# Patient Record
Sex: Male | Born: 1995 | Race: Black or African American | Hispanic: No | Marital: Single | Smoking: Never smoker
Health system: Southern US, Community
[De-identification: ages and names within clinical notes are randomized; demographics above are authoritative.]

## PROBLEM LIST (undated history)

## (undated) DIAGNOSIS — K625 Hemorrhage of anus and rectum: Secondary | ICD-10-CM

## (undated) DIAGNOSIS — F819 Developmental disorder of scholastic skills, unspecified: Secondary | ICD-10-CM

## (undated) DIAGNOSIS — K5900 Constipation, unspecified: Secondary | ICD-10-CM

## (undated) DIAGNOSIS — H189 Unspecified disorder of cornea: Secondary | ICD-10-CM

## (undated) HISTORY — DX: Hemorrhage of anus and rectum: K62.5

## (undated) HISTORY — PX: TYMPANOSTOMY TUBE PLACEMENT: SHX32

## (undated) HISTORY — DX: Constipation, unspecified: K59.00

---
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
## 2018-10-24 IMAGING — CT CT RENAL STONE PROTOCOL
2 of 4 series · 15 of 46 positions shown, 17 images · non-contrast
Comparison: None.

CLINICAL DATA: dysuria, hematuria starting 5 a.m. this morning

EXAM:
CT ABDOMEN AND PELVIS WITHOUT CONTRAST
TECHNIQUE: Multidetector CT imaging of the abdomen and pelvis was performed
following the standard protocol without IV contrast.

[Series 2: stone full standard · axial · 0.81mm/px · z∈[-698,-258]mm · 12 of 105 slices shown, 14 images]
[im 9/105  soft-tissue]
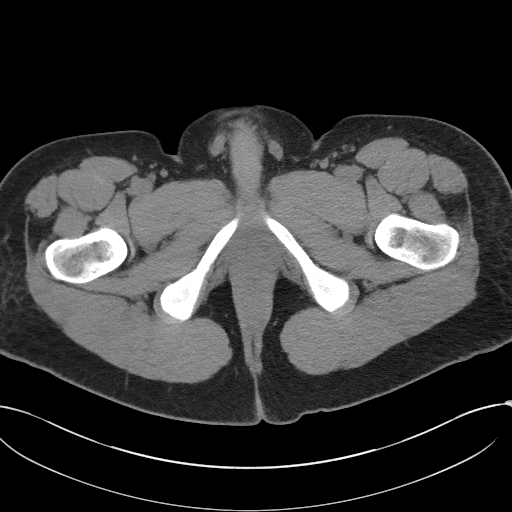
[im 9/105  bone]
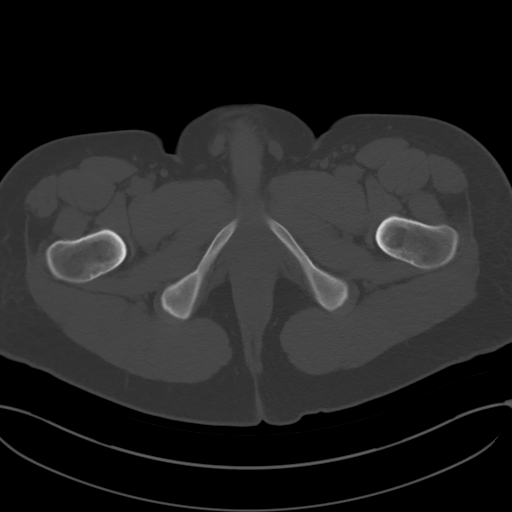
[im 17/105  soft-tissue]
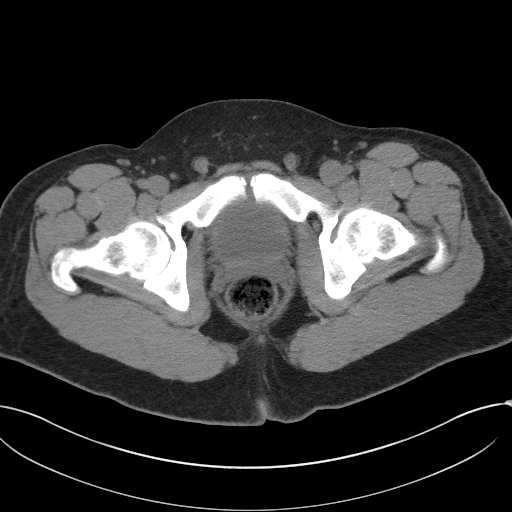
[im 25/105  soft-tissue]
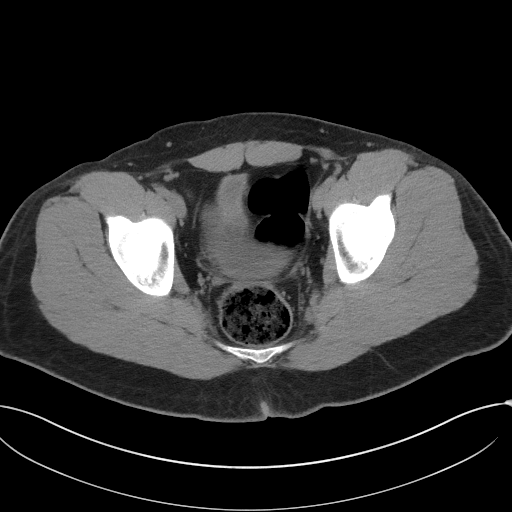
[im 33/105  soft-tissue]
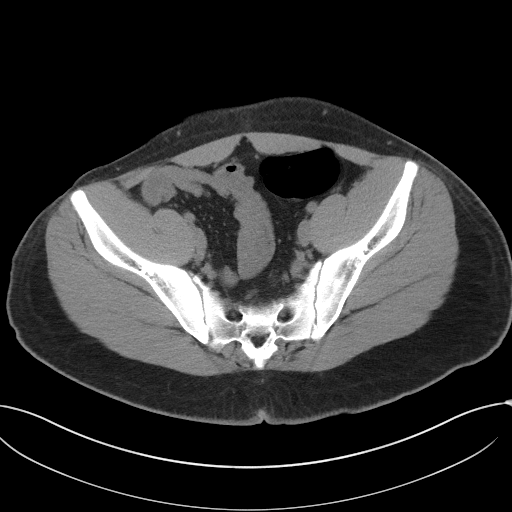
[im 41/105  soft-tissue]
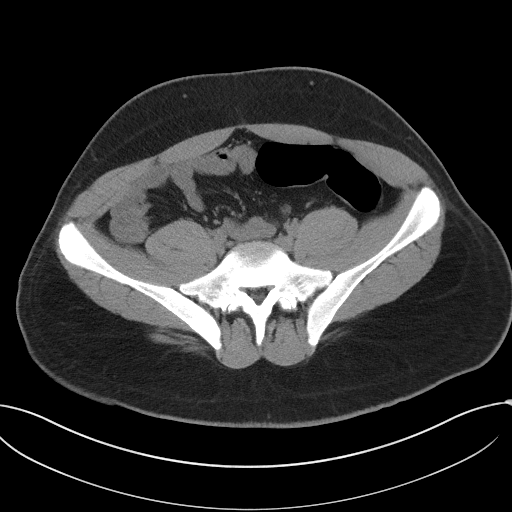
[im 49/105  soft-tissue]
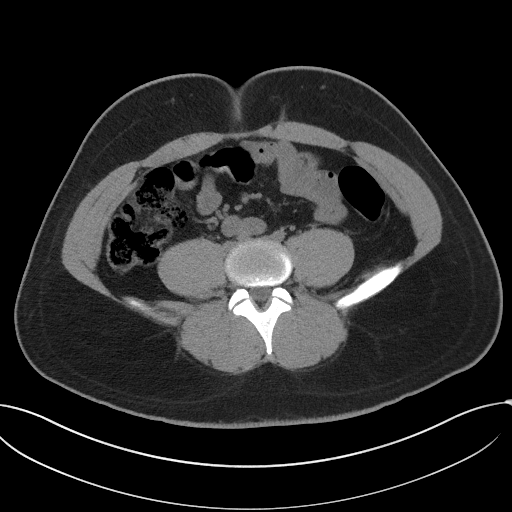
[im 57/105  soft-tissue]
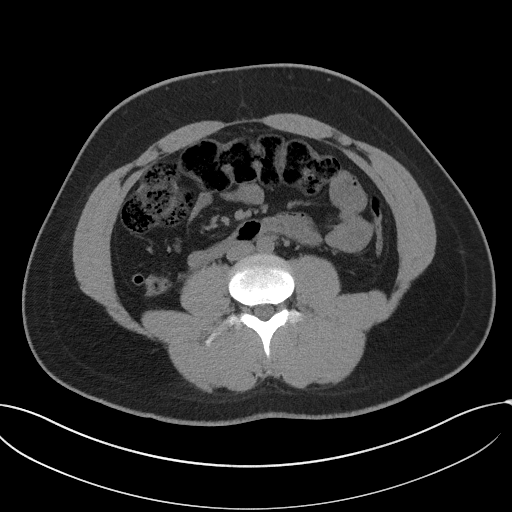
[im 65/105  soft-tissue]
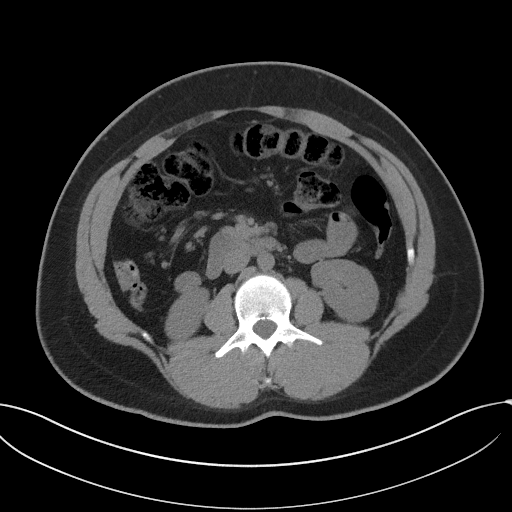
[im 73/105  soft-tissue]
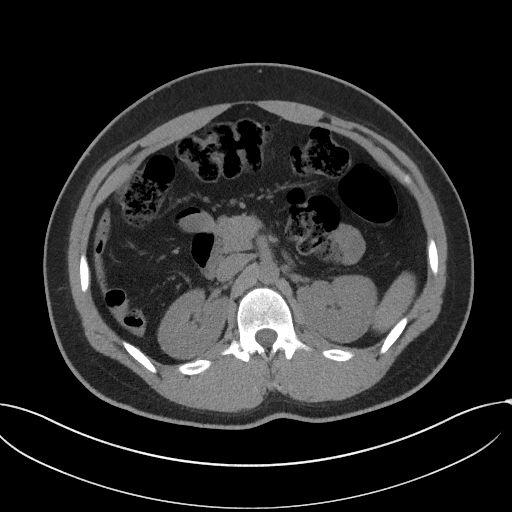
[im 73/105  bone]
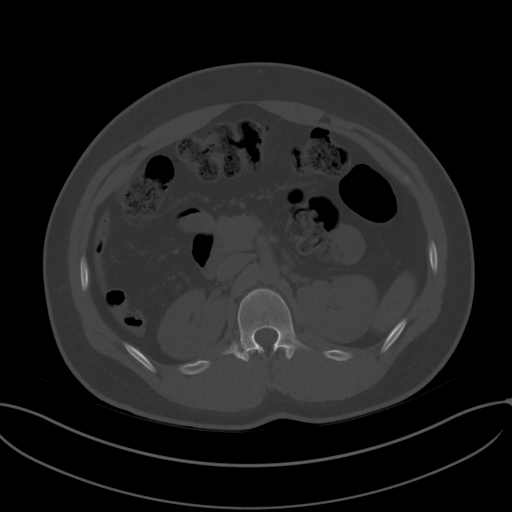
[im 81/105  soft-tissue]
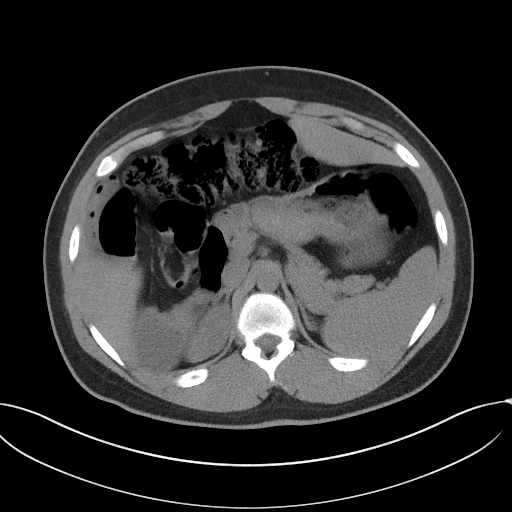
[im 89/105  soft-tissue]
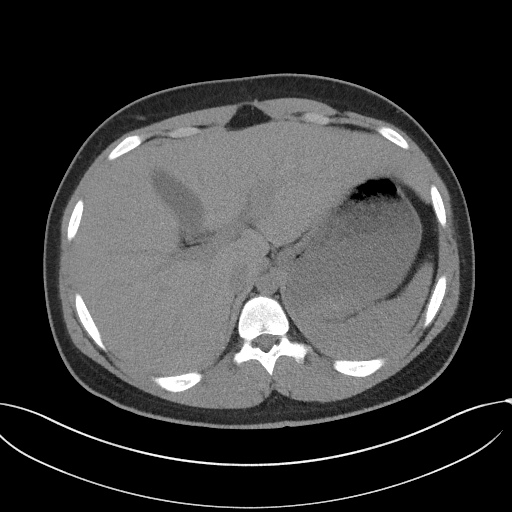
[im 97/105  soft-tissue]
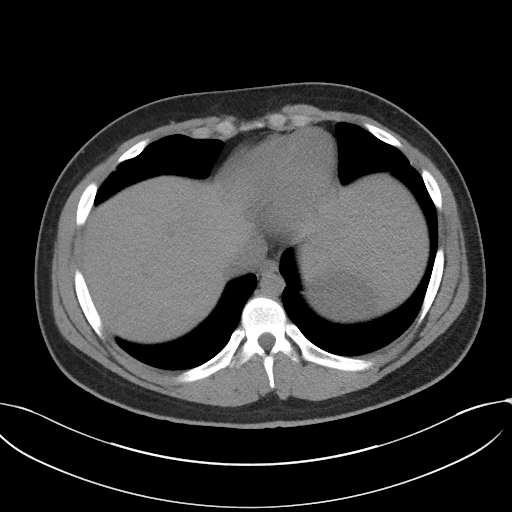

[Series 5: coronal · coronal · 0.90mm/px · 3 of 141 slices shown]
[im 47/141  soft-tissue]
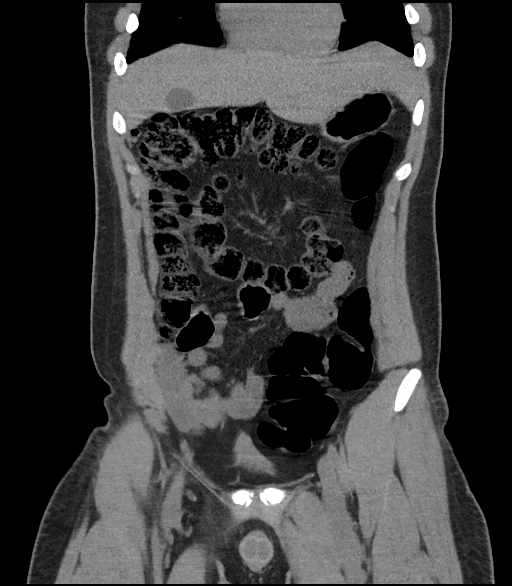
[im 63/141  soft-tissue]
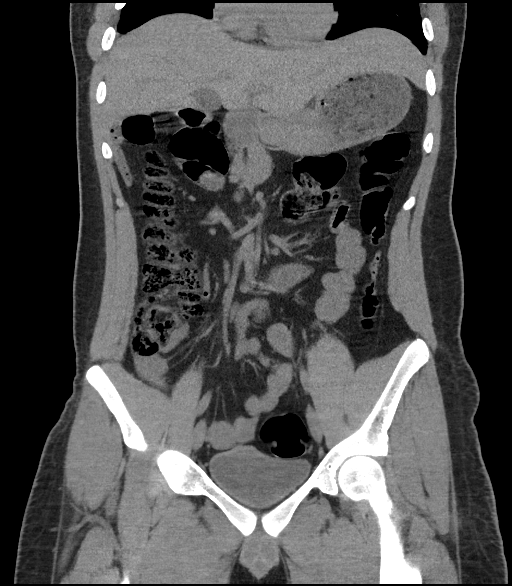
[im 78/141  soft-tissue]
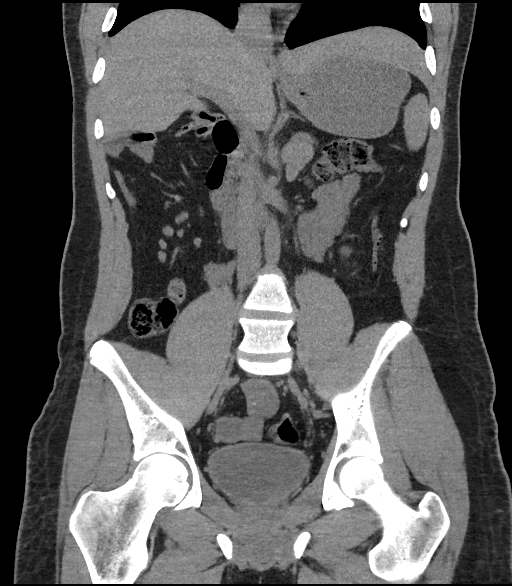

[15 of 46 positions shown; findings below may reference images not displayed]

FINDINGS: Lower chest: Lung bases shows no acute findings

Hepatobiliary: No focal liver abnormality enhanced liver. No biliary
ductal dilatation. No thickening of gallbladder wall.

Pancreas: Unremarkable. No pancreatic ductal dilatation or
surrounding inflammatory changes.

Spleen: Normal in size without focal abnormality.

Adrenals/Urinary Tract: No adrenal gland mass. Unenhanced kidneys
shows no nephrolithiasis. No hydronephrosis or hydroureter. No
calcified ureteral calculi. No calcified calculi are noted within
urinary bladder.

Stomach/Bowel: No gastric outlet obstruction. No thickened or
dilated small bowel loops.

Study is limited without IV and oral contrast. There is no small
bowel obstruction.

There is no pericecal inflammation. The terminal ileum is
unremarkable. Moderate stool noted with cecum. Moderate stool noted
within the ascending colon. Normal appendix partially visualized in
axial image 48. Moderate to abundant stool noted within transverse
colon. There is redundant transverse colon please see coronal image
42. Moderate gas noted within descending colon. Moderate stool noted
within rectum. The rectum measures about 5.6 cm in diameter. Mild
fecal impaction cannot be excluded. No definite evidence of acute
colitis or diverticulitis.

Vascular/Lymphatic: No aortic aneurysm. No retroperitoneal
adenopathy. Mild enlarged lymph nodes are noted in right lower
quadrant mesentery the largest in axial image 45 measures 1.1 cm.
This might be reactive in nature. Mesenteric adenitis cannot be
excluded. Less likely lymphoproliferative disease.

Reproductive: Prostate gland and seminal vesicles are unremarkable.

Other: There is no evidence of ascites or free abdominal air. No
inguinal adenopathy.

Musculoskeletal: No destructive bony lesions are noted. Sagittal
images of the spine are unremarkable. Alignment and vertebral body
heights are preserved.
IMPRESSION: 1. There is no evidence of nephrolithiasis. No hydronephrosis or
hydroureter. No calcified ureteral calculi.
2. Moderate stool noted within cecum. Limited study without IV and
oral contrast. Moderate stool noted within the ascending colon.
Moderate to abundant stool noted within transverse colon.
Significant redundant transverse colon. No pericecal inflammation.
Normal appendix partially visualized.
3. Mild enlarged lymph nodes in right lower quadrant mesentery
probable reactive or due to mesenteric adenitis. Clinical
correlation is necessary.
4. Moderate stool noted within rectum. Moderate gas within
descending colon and sigmoid colon. Mild distal fecal impaction
cannot be excluded.
5. No calcified calculi are noted within nondistended urinary
bladder.
